# Patient Record
Sex: Male | Born: 1985
Health system: Southern US, Community
[De-identification: ages and names within clinical notes are randomized; demographics above are authoritative.]

---
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
## 2009-01-05 ENCOUNTER — Encounter: Admission: RE | Admit: 2009-01-05 | Discharge: 2009-01-05 | Payer: Self-pay | Admitting: Family Medicine

## 2015-08-04 MED FILL — FINASTERIDE 1 MG TABLET: 1 | 90 days supply | Qty: 90 | Fill #0

## 2015-10-26 MED FILL — FINASTERIDE 1 MG TABLET: 1 | 90 days supply | Qty: 90 | Fill #1

## 2016-01-16 MED FILL — FINASTERIDE 1 MG TABLET: 1 | 90 days supply | Qty: 90 | Fill #2

## 2016-01-30 DIAGNOSIS — B360 Pityriasis versicolor: Secondary | ICD-10-CM | POA: Diagnosis not present

## 2016-01-30 DIAGNOSIS — Z209 Contact with and (suspected) exposure to unspecified communicable disease: Secondary | ICD-10-CM | POA: Diagnosis not present

## 2016-01-30 DIAGNOSIS — F900 Attention-deficit hyperactivity disorder, predominantly inattentive type: Secondary | ICD-10-CM | POA: Diagnosis not present

## 2016-01-30 DIAGNOSIS — E669 Obesity, unspecified: Secondary | ICD-10-CM | POA: Diagnosis not present

## 2016-01-30 DIAGNOSIS — Z5181 Encounter for therapeutic drug level monitoring: Secondary | ICD-10-CM | POA: Diagnosis not present

## 2016-01-30 DIAGNOSIS — Z0000 Encounter for general adult medical examination without abnormal findings: Secondary | ICD-10-CM | POA: Diagnosis not present

## 2016-05-01 MED FILL — FINASTERIDE 1 MG TABLET: 1 | 90 days supply | Qty: 90 | Fill #3

## 2016-06-22 DIAGNOSIS — Z23 Encounter for immunization: Secondary | ICD-10-CM | POA: Diagnosis not present

## 2016-07-18 MED FILL — PREVIDENT 5000 SENSITIVE PA: 1.1-5 | 30 days supply | Qty: 100 | Fill #0 | Status: TO

## 2016-07-19 MED FILL — FINASTERIDE 1 MG TABLET: 1 | 90 days supply | Qty: 90 | Fill #0 | Status: TO

## 2016-08-03 DIAGNOSIS — R635 Abnormal weight gain: Secondary | ICD-10-CM | POA: Diagnosis not present

## 2016-08-03 DIAGNOSIS — F900 Attention-deficit hyperactivity disorder, predominantly inattentive type: Secondary | ICD-10-CM | POA: Diagnosis not present

## 2016-08-03 DIAGNOSIS — R7309 Other abnormal glucose: Secondary | ICD-10-CM | POA: Diagnosis not present

## 2017-01-15 DIAGNOSIS — J208 Acute bronchitis due to other specified organisms: Secondary | ICD-10-CM | POA: Diagnosis not present

## 2017-01-28 MED FILL — PREVIDENT 5000 SENSITIVE PA: 1.1-5 | 30 days supply | Qty: 100 | Fill #0

## 2017-01-31 DIAGNOSIS — Z0000 Encounter for general adult medical examination without abnormal findings: Secondary | ICD-10-CM | POA: Diagnosis not present

## 2017-01-31 DIAGNOSIS — R7309 Other abnormal glucose: Secondary | ICD-10-CM | POA: Diagnosis not present

## 2017-09-06 DIAGNOSIS — R7309 Other abnormal glucose: Secondary | ICD-10-CM | POA: Diagnosis not present

## 2017-09-06 DIAGNOSIS — F900 Attention-deficit hyperactivity disorder, predominantly inattentive type: Secondary | ICD-10-CM | POA: Diagnosis not present

## 2017-11-27 DIAGNOSIS — F419 Anxiety disorder, unspecified: Secondary | ICD-10-CM | POA: Diagnosis not present

## 2018-04-04 DIAGNOSIS — Z23 Encounter for immunization: Secondary | ICD-10-CM | POA: Diagnosis not present

## 2018-05-09 DIAGNOSIS — F900 Attention-deficit hyperactivity disorder, predominantly inattentive type: Secondary | ICD-10-CM | POA: Diagnosis not present

## 2018-05-09 DIAGNOSIS — E669 Obesity, unspecified: Secondary | ICD-10-CM | POA: Diagnosis not present

## 2018-05-09 DIAGNOSIS — Z730 Burn-out: Secondary | ICD-10-CM | POA: Diagnosis not present

## 2018-07-21 DIAGNOSIS — J111 Influenza due to unidentified influenza virus with other respiratory manifestations: Secondary | ICD-10-CM | POA: Diagnosis not present

## 2018-12-02 DIAGNOSIS — E669 Obesity, unspecified: Secondary | ICD-10-CM | POA: Diagnosis not present

## 2018-12-02 DIAGNOSIS — F322 Major depressive disorder, single episode, severe without psychotic features: Secondary | ICD-10-CM | POA: Diagnosis not present

## 2018-12-02 DIAGNOSIS — F900 Attention-deficit hyperactivity disorder, predominantly inattentive type: Secondary | ICD-10-CM | POA: Diagnosis not present

## 2018-12-02 DIAGNOSIS — L649 Androgenic alopecia, unspecified: Secondary | ICD-10-CM | POA: Diagnosis not present

## 2019-01-28 MED FILL — VYVANSE 30 MG CAPSULE: 30 | 30 days supply | Qty: 30 | Fill #0

## 2019-01-29 MED FILL — ESCITALOPRAM 10 MG TABLET: 10 | 90 days supply | Qty: 90 | Fill #0

## 2019-02-25 MED FILL — VYVANSE 30 MG CAPSULE: 30 | 30 days supply | Qty: 30 | Fill #0

## 2020-06-01 ENCOUNTER — Other Ambulatory Visit (HOSPITAL_COMMUNITY): Payer: Self-pay | Admitting: Internal Medicine

## 2020-06-01 MED FILL — FLUARIX QUADRIVALENT 0.5 ML: 0.5 | 1 days supply | Qty: 1 | Fill #0

## 2020-07-19 ENCOUNTER — Ambulatory Visit: Payer: Self-pay | Attending: Internal Medicine

## 2020-07-19 ENCOUNTER — Other Ambulatory Visit (HOSPITAL_BASED_OUTPATIENT_CLINIC_OR_DEPARTMENT_OTHER): Payer: Self-pay | Admitting: Internal Medicine

## 2020-07-19 DIAGNOSIS — Z23 Encounter for immunization: Secondary | ICD-10-CM

## 2020-07-19 NOTE — Progress Notes (Signed)
   [facility] Vaccination [facility]  Name:  MONTRE HARBOR    MRN: [id number] DOB: 1985/10/26  07/19/2020  Mr. Cedrone was observed post Covid-19 immunization for 15 minutes without incident. He was provided with Vaccine Information Sheet and instruction to access the V-Safe system.   Mr. Schreiner was instructed to call 911 with any severe reactions post vaccine: Marland Kitchen Difficulty breathing  . Swelling of face and throat  . A fast heartbeat  . A bad rash all over body  . Dizziness and weakness   Immunizations Administered    Name Date Dose VIS Date Route   Pfizer COVID-19 Vaccine 07/19/2020 11:39 AM 0.3 mL 05/18/2020 Intramuscular   Manufacturer: [employer], [employer]   Lot: [id number]   NDC: [id number]

## 2020-07-21 MED FILL — PFIZER-BIONTECH COVID-19 VA: 30 | 21 days supply | Qty: 0 | Fill #0

## 2020-08-01 DIAGNOSIS — F331 Major depressive disorder, recurrent, moderate: Secondary | ICD-10-CM | POA: Diagnosis not present

## 2020-08-01 DIAGNOSIS — F900 Attention-deficit hyperactivity disorder, predominantly inattentive type: Secondary | ICD-10-CM | POA: Diagnosis not present

## 2020-08-01 DIAGNOSIS — F3341 Major depressive disorder, recurrent, in partial remission: Secondary | ICD-10-CM | POA: Diagnosis not present

## 2020-08-01 DIAGNOSIS — R7303 Prediabetes: Secondary | ICD-10-CM | POA: Diagnosis not present

## 2020-08-08 DIAGNOSIS — F331 Major depressive disorder, recurrent, moderate: Secondary | ICD-10-CM | POA: Diagnosis not present

## 2020-08-15 DIAGNOSIS — F331 Major depressive disorder, recurrent, moderate: Secondary | ICD-10-CM | POA: Diagnosis not present

## 2020-08-29 DIAGNOSIS — F331 Major depressive disorder, recurrent, moderate: Secondary | ICD-10-CM | POA: Diagnosis not present

## 2020-09-05 DIAGNOSIS — F331 Major depressive disorder, recurrent, moderate: Secondary | ICD-10-CM | POA: Diagnosis not present

## 2020-09-12 DIAGNOSIS — F331 Major depressive disorder, recurrent, moderate: Secondary | ICD-10-CM | POA: Diagnosis not present

## 2020-09-19 DIAGNOSIS — F331 Major depressive disorder, recurrent, moderate: Secondary | ICD-10-CM | POA: Diagnosis not present

## 2020-09-26 DIAGNOSIS — F331 Major depressive disorder, recurrent, moderate: Secondary | ICD-10-CM | POA: Diagnosis not present

## 2020-10-03 DIAGNOSIS — F331 Major depressive disorder, recurrent, moderate: Secondary | ICD-10-CM | POA: Diagnosis not present

## 2020-10-10 DIAGNOSIS — F331 Major depressive disorder, recurrent, moderate: Secondary | ICD-10-CM | POA: Diagnosis not present

## 2020-10-17 DIAGNOSIS — F331 Major depressive disorder, recurrent, moderate: Secondary | ICD-10-CM | POA: Diagnosis not present

## 2020-10-24 DIAGNOSIS — F331 Major depressive disorder, recurrent, moderate: Secondary | ICD-10-CM | POA: Diagnosis not present

## 2020-10-31 DIAGNOSIS — F331 Major depressive disorder, recurrent, moderate: Secondary | ICD-10-CM | POA: Diagnosis not present

## 2020-11-07 DIAGNOSIS — F331 Major depressive disorder, recurrent, moderate: Secondary | ICD-10-CM | POA: Diagnosis not present

## 2020-11-14 DIAGNOSIS — F331 Major depressive disorder, recurrent, moderate: Secondary | ICD-10-CM | POA: Diagnosis not present

## 2020-11-21 DIAGNOSIS — F331 Major depressive disorder, recurrent, moderate: Secondary | ICD-10-CM | POA: Diagnosis not present

## 2020-11-28 DIAGNOSIS — F331 Major depressive disorder, recurrent, moderate: Secondary | ICD-10-CM | POA: Diagnosis not present

## 2020-12-05 DIAGNOSIS — F331 Major depressive disorder, recurrent, moderate: Secondary | ICD-10-CM | POA: Diagnosis not present

## 2020-12-12 ENCOUNTER — Other Ambulatory Visit: Payer: Self-pay | Admitting: Family Medicine

## 2020-12-12 DIAGNOSIS — F331 Major depressive disorder, recurrent, moderate: Secondary | ICD-10-CM | POA: Diagnosis not present

## 2020-12-12 DIAGNOSIS — R7303 Prediabetes: Secondary | ICD-10-CM | POA: Diagnosis not present

## 2020-12-12 DIAGNOSIS — N5089 Other specified disorders of the male genital organs: Secondary | ICD-10-CM

## 2020-12-12 DIAGNOSIS — F3341 Major depressive disorder, recurrent, in partial remission: Secondary | ICD-10-CM | POA: Diagnosis not present

## 2020-12-12 DIAGNOSIS — Z13220 Encounter for screening for lipoid disorders: Secondary | ICD-10-CM | POA: Diagnosis not present

## 2020-12-12 DIAGNOSIS — F900 Attention-deficit hyperactivity disorder, predominantly inattentive type: Secondary | ICD-10-CM | POA: Diagnosis not present

## 2020-12-19 DIAGNOSIS — F331 Major depressive disorder, recurrent, moderate: Secondary | ICD-10-CM | POA: Diagnosis not present

## 2020-12-22 ENCOUNTER — Ambulatory Visit
Admission: RE | Admit: 2020-12-22 | Discharge: 2020-12-22 | Disposition: A | Discharge status: Home / Self Care | Payer: BC Managed Care – PPO | Source: Ambulatory Visit | Attending: Family Medicine | Admitting: Family Medicine

## 2020-12-22 DIAGNOSIS — N5089 Other specified disorders of the male genital organs: Secondary | ICD-10-CM

## 2020-12-22 DIAGNOSIS — N503 Cyst of epididymis: Secondary | ICD-10-CM | POA: Diagnosis not present

## 2020-12-26 DIAGNOSIS — F331 Major depressive disorder, recurrent, moderate: Secondary | ICD-10-CM | POA: Diagnosis not present

## 2021-01-09 DIAGNOSIS — F331 Major depressive disorder, recurrent, moderate: Secondary | ICD-10-CM | POA: Diagnosis not present

## 2021-04-12 ENCOUNTER — Other Ambulatory Visit (HOSPITAL_COMMUNITY): Payer: Self-pay

## 2021-06-21 ENCOUNTER — Other Ambulatory Visit (HOSPITAL_COMMUNITY): Payer: Self-pay

## 2021-06-21 MED ORDER — INFLUENZA VAC SPLIT QUAD 0.5 ML IM SUSY
PREFILLED_SYRINGE | INTRAMUSCULAR | 0 refills | Status: AC
  Filled 2021-06-21: qty 0.5, 1d supply, fill #0

## 2021-09-27 ENCOUNTER — Other Ambulatory Visit (HOSPITAL_COMMUNITY): Payer: Self-pay

## 2023-05-10 IMAGING — US US SCROTUM W/ DOPPLER COMPLETE
1 series · 13 of 25 positions shown · non-contrast
Comparison: None

CLINICAL DATA: LEFT testicular mass, palpable area superior to LEFT
testis

EXAM:
SCROTAL ULTRASOUND
DOPPLER ULTRASOUND OF THE TESTICLES
TECHNIQUE: Complete ultrasound examination of the testicles, epididymis, and
other scrotal structures was performed. Color and spectral Doppler
ultrasound were also utilized to evaluate blood flow to the
testicles.

[Series 1: us scrotum w/ doppler complete · 0.08mm/px · 13 of 61 slices shown]
[im 1/61]
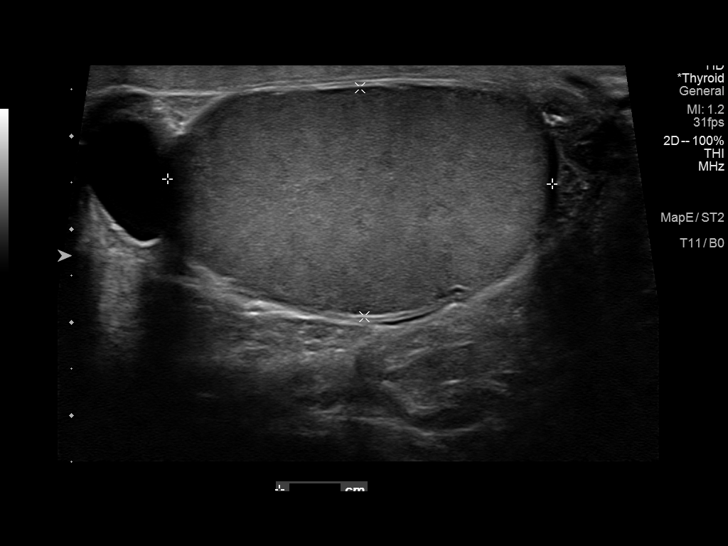
[im 6/61]
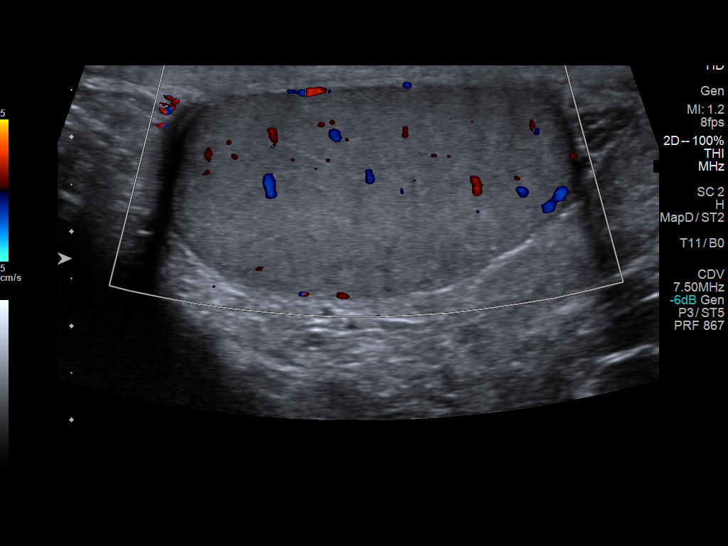
[im 11/61]
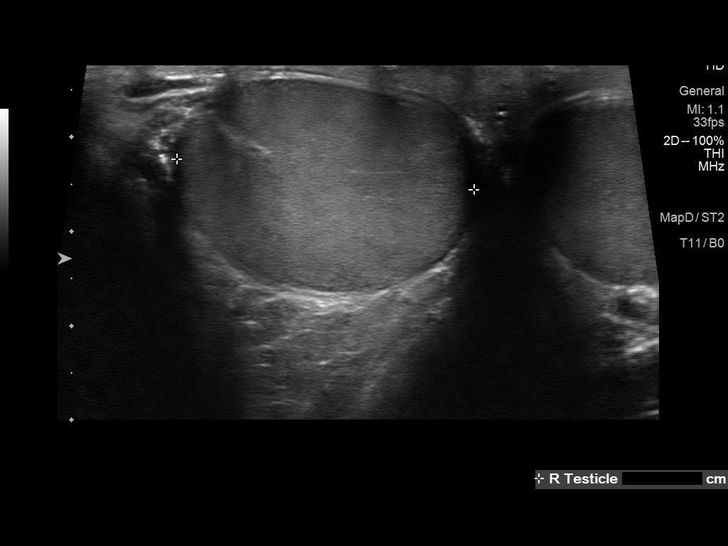
[im 16/61]
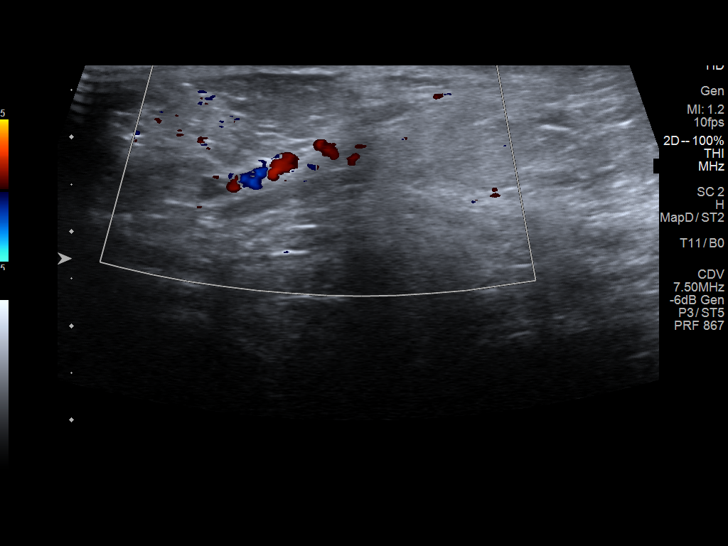
[im 21/61]
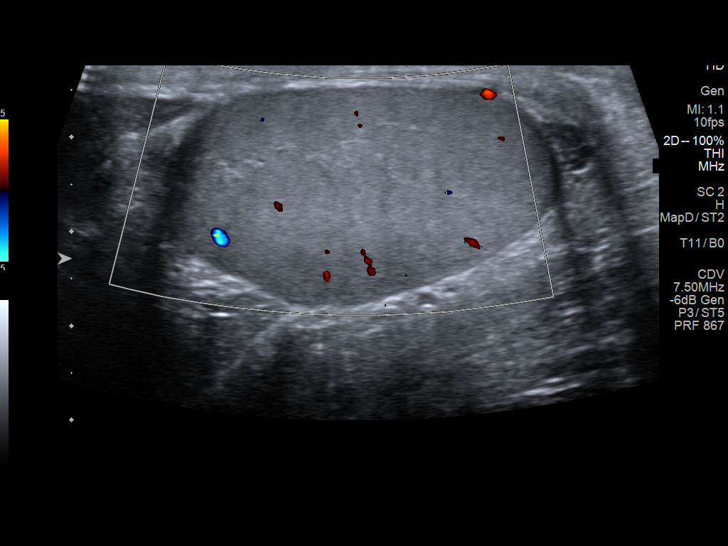
[im 26/61]
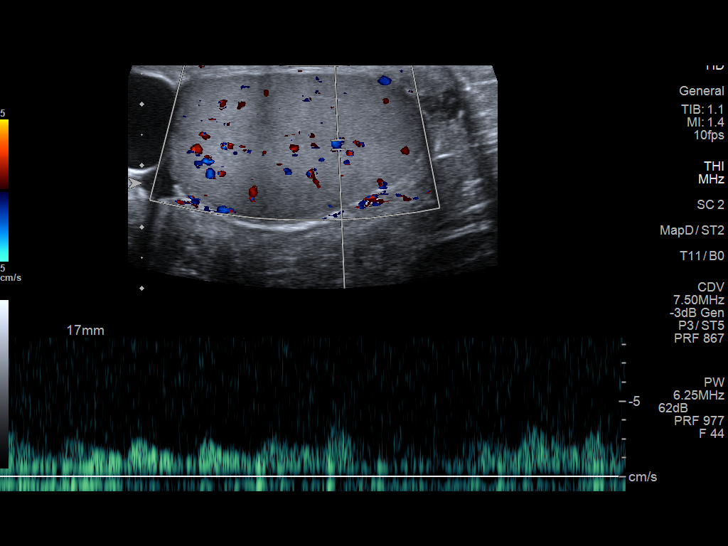
[im 31/61]
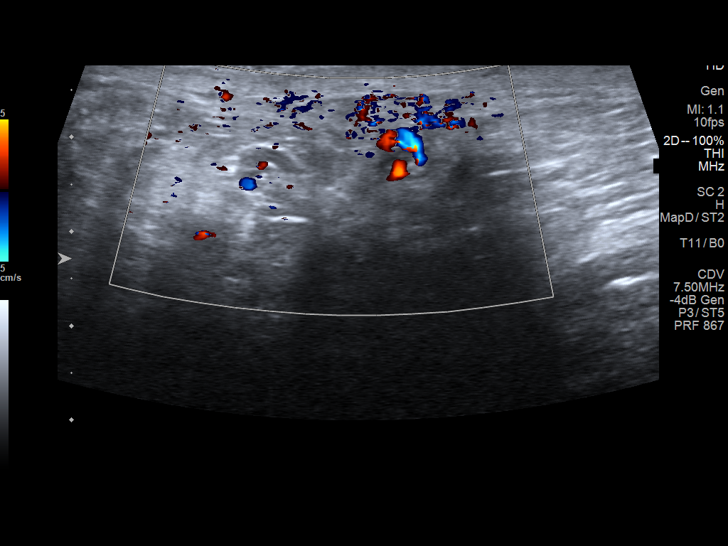
[im 36/61]
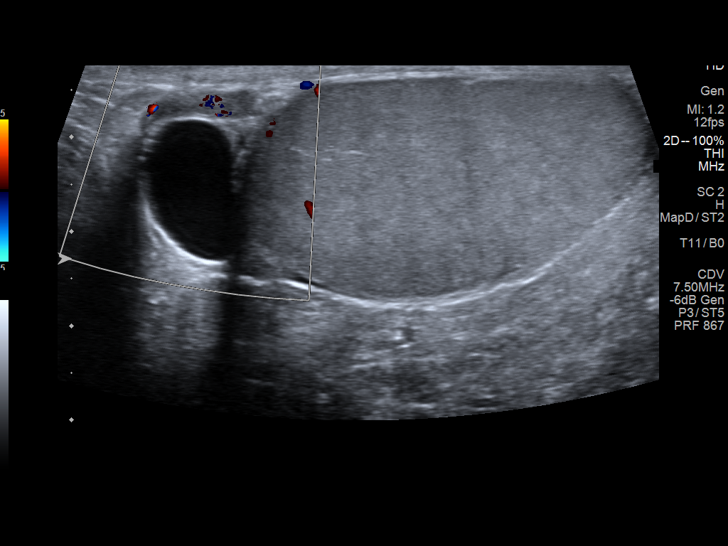
[im 41/61]
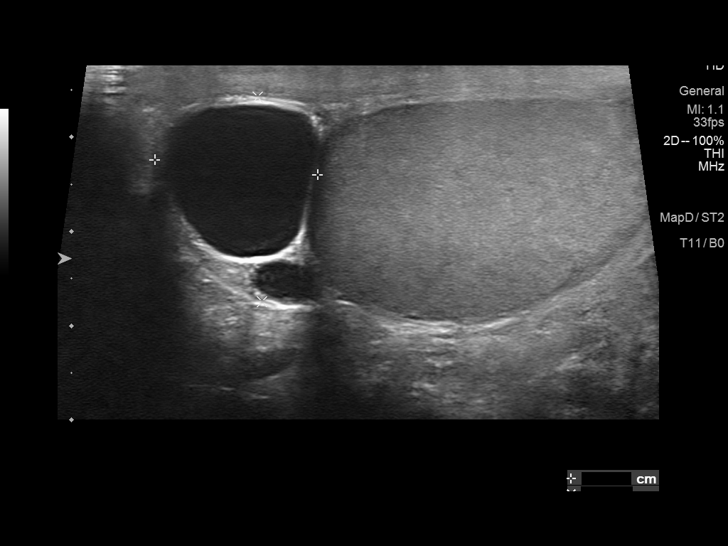
[im 46/61]
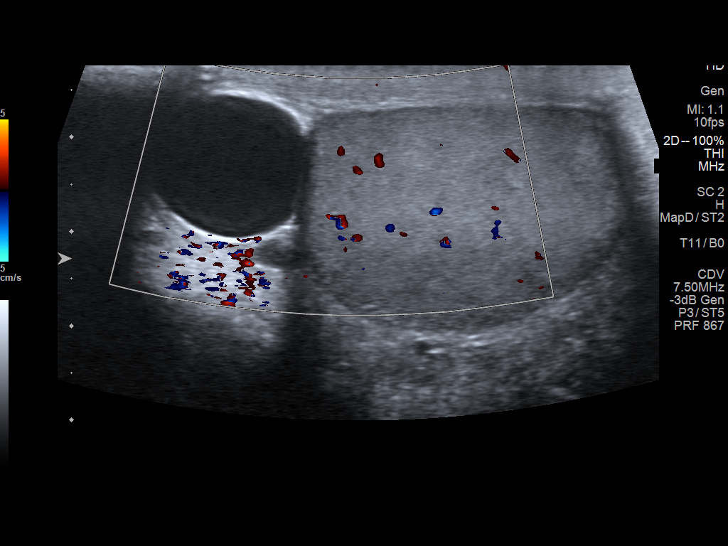
[im 51/61]
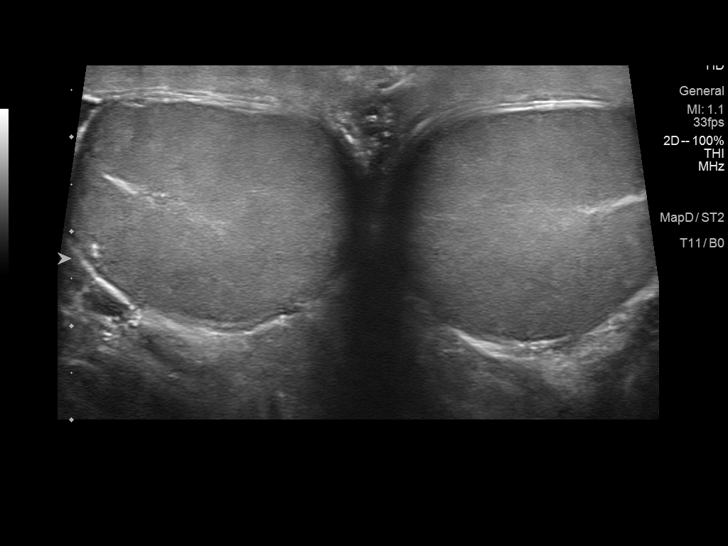
[im 56/61]
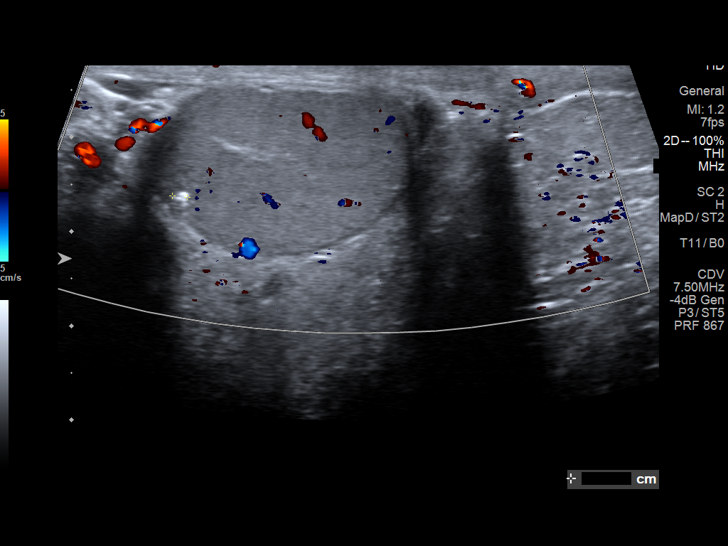
[im 61/61]
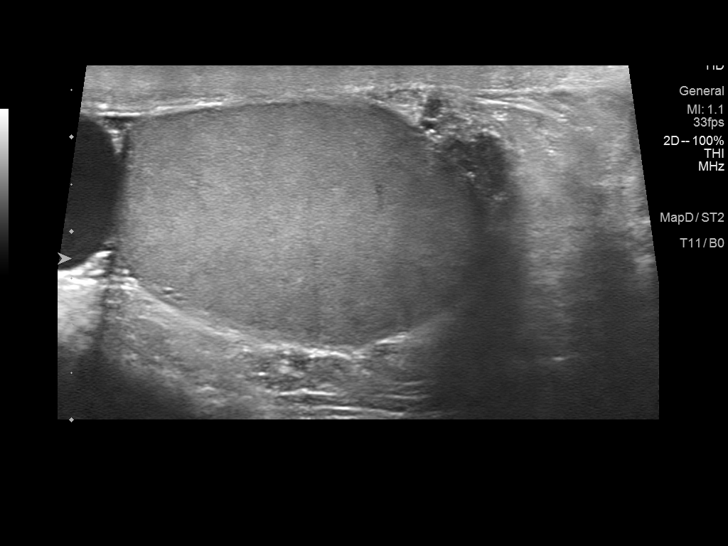

[13 of 25 positions shown; findings below may reference images not displayed]

FINDINGS: Right testicle

Measurements: 4.1 x 2.5 x 3.2 cm. Normal echogenicity without mass
or calcification. Internal blood flow present on color Doppler
imaging.

Left testicle

Measurements: 4.2 x 2.4 x 3.0 cm. Tiny cluster of
microcalcifications inferior RIGHT lateral testis, 2 mm diameter. No
mass or additional calcifications. Internal blood flow present on
color Doppler imaging.

Right epididymis:  Cyst at RIGHT epididymal head 15 x 10 x 13 mm

Left epididymis: 2 cysts at LEFT epididymal head, larger 17 x 13 x
18 mm, smaller 6 mm greatest diameter.

Hydrocele:  None visualized.

Varicocele:  None visualized.

Pulsed Doppler interrogation of both testes demonstrates normal low
resistance arterial and venous waveforms bilaterally.
IMPRESSION: Normal echogenicity without mass or calcification.

Cysts at the epididymal heads bilaterally, larger on LEFT measuring
17 mm in greatest diameter, corresponding to palpable abnormality.

Tiny cluster of nonspecific microcalcifications in the inferior
RIGHT testis.

Otherwise negative exam.
